# Patient Record
Sex: Male | Born: 1970 | Race: Black or African American | Hispanic: No | Smoking: Current every day smoker
Health system: Southern US, Community
[De-identification: ages and names within clinical notes are randomized; demographics above are authoritative.]

## PROBLEM LIST (undated history)

## (undated) HISTORY — PX: APPENDECTOMY: SHX54

---

## 2003-09-28 ENCOUNTER — Emergency Department (HOSPITAL_COMMUNITY): Admission: EM | Admit: 2003-09-28 | Discharge: 2003-09-28 | Payer: Self-pay | Admitting: Emergency Medicine

## 2004-02-14 ENCOUNTER — Emergency Department (HOSPITAL_COMMUNITY): Admission: EM | Admit: 2004-02-14 | Discharge: 2004-02-14 | Payer: Self-pay | Admitting: Emergency Medicine

## 2004-02-14 ENCOUNTER — Emergency Department (HOSPITAL_COMMUNITY): Admission: EM | Admit: 2004-02-14 | Discharge: 2004-02-14 | Payer: Self-pay | Admitting: Family Medicine

## 2004-11-05 ENCOUNTER — Emergency Department (HOSPITAL_COMMUNITY): Admission: EM | Admit: 2004-11-05 | Discharge: 2004-11-05 | Payer: Self-pay | Admitting: Emergency Medicine

## 2005-07-17 ENCOUNTER — Emergency Department (HOSPITAL_COMMUNITY): Admission: EM | Admit: 2005-07-17 | Discharge: 2005-07-17 | Payer: Self-pay | Admitting: *Deleted

## 2006-10-18 ENCOUNTER — Ambulatory Visit: Payer: Self-pay | Admitting: *Deleted

## 2006-10-18 ENCOUNTER — Ambulatory Visit: Payer: Self-pay | Admitting: Family Medicine

## 2008-01-24 ENCOUNTER — Emergency Department (HOSPITAL_COMMUNITY): Admission: EM | Admit: 2008-01-24 | Discharge: 2008-01-24 | Payer: Self-pay | Admitting: Family Medicine

## 2009-09-29 ENCOUNTER — Ambulatory Visit: Payer: Self-pay | Admitting: Internal Medicine

## 2012-08-17 ENCOUNTER — Emergency Department (INDEPENDENT_AMBULATORY_CARE_PROVIDER_SITE_OTHER)
Admission: EM | Admit: 2012-08-17 | Discharge: 2012-08-17 | Disposition: A | Discharge status: Home / Self Care | Payer: Self-pay | Source: Home / Self Care

## 2012-08-17 ENCOUNTER — Emergency Department (INDEPENDENT_AMBULATORY_CARE_PROVIDER_SITE_OTHER): Payer: Self-pay

## 2012-08-17 ENCOUNTER — Encounter (HOSPITAL_COMMUNITY): Payer: Self-pay | Admitting: Emergency Medicine

## 2012-08-17 DIAGNOSIS — M79609 Pain in unspecified limb: Secondary | ICD-10-CM

## 2012-08-17 DIAGNOSIS — S62639A Displaced fracture of distal phalanx of unspecified finger, initial encounter for closed fracture: Secondary | ICD-10-CM

## 2012-08-17 DIAGNOSIS — M5412 Radiculopathy, cervical region: Secondary | ICD-10-CM

## 2012-08-17 DIAGNOSIS — M79645 Pain in left finger(s): Secondary | ICD-10-CM

## 2012-08-17 NOTE — ED Notes (Signed)
Pt is here for left ring finger inj  Reports that a metal stand weighing about 45-50lbs smashed his finger while taking out from a vehicle States his left arm gave out on him and went numb; been going on for some yrs now; no PCP Laceration at tip of finger  Last tetanus was w/in a year Took tyle for the discomfort  He is alert and oriented w/no signs of acute distress.

## 2012-08-17 NOTE — ED Provider Notes (Signed)
History     CSN: [id number]  Arrival date & time 08/17/12  1414   None     Chief Complaint  Patient presents with  . Finger Injury    (Consider location/radiation/quality/duration/timing/severity/associated sxs/prior treatment) HPI Comments: Travis West presents today with a left 4th finger injury. Dropped a box at worked yesterday, and finger caught between 2 heavy objects at work. Small cut to the finger pad, and swelling, worsening since last night. No nail involvement. He has had previous work up for numbness and tingling in his left arm sporadically. He was told in the past possible "disc" in his neck but has not sought further treatment. This is the reason for dropping the box yesterday. He denies loss of strength in the left arm, mild cervical stiffness, and noted left para scapular discomfort at times.   The history is provided by the patient.    History reviewed. No pertinent past medical history.  History reviewed. No pertinent past surgical history.  No family history on file.  History  Substance Use Topics  . Smoking status: Current Every Day Smoker    Types: Cigarettes  . Smokeless tobacco: Not on file  . Alcohol Use: Yes      Review of Systems  Constitutional: Negative.   Neurological: Positive for numbness. Negative for dizziness, tremors, seizures, syncope and weakness.    Allergies  Review of patient's allergies indicates no known allergies.  Home Medications  No current outpatient prescriptions on file.  BP 129/76  Pulse 67  Temp(Src) 98.4 F (36.9 C) (Oral)  Resp 18  SpO2 98%  Physical Exam  Constitutional: He is oriented to person, place, and time. He appears well-developed and well-nourished.  Neck: Neck supple.  Musculoskeletal: Normal range of motion.  Neurological: He is oriented to person, place, and time. He displays normal reflexes. He exhibits normal muscle tone.  Skin: Skin is warm and dry.    ED Course  Procedures  (including critical care time)  Labs Reviewed - No data to display No results found.   No diagnosis found.    MDM   Distal Tuft fracture-Splint-Hand Ortho f/u Cervical Radiculopathy f/u       Azucena Fallen, PA-C 08/17/12 1522

## 2012-08-19 NOTE — ED Provider Notes (Signed)
Medical screening examination/treatment/procedure(s) were performed by resident physician or non-physician practitioner and as supervising physician I was immediately available for consultation/collaboration.   Barkley Bruns MD.   Linna Hoff, MD 08/19/12 [phone]

## 2013-01-30 ENCOUNTER — Ambulatory Visit: Payer: Self-pay | Admitting: Emergency Medicine

## 2013-01-30 VITALS — BP 138/84 | HR 69 | Temp 98.9°F | Resp 18 | Ht 72.0 in | Wt 199.0 lb

## 2013-01-30 DIAGNOSIS — Z0289 Encounter for other administrative examinations: Secondary | ICD-10-CM

## 2013-01-30 NOTE — Progress Notes (Signed)
  Subjective:    Patient ID: Travis West, male    DOB: 20-Aug-1970, 42 y.o.   MRN: [id number]  HPI  DOT certification  Review of Systems    As per HPI, otherwise negative.   Objective:   Physical Exam  GEN: WDWN, NAD, Non-toxic, A & O x 3 HEENT: Atraumatic, Normocephalic. Neck supple. No masses, No LAD. Ears and Nose: No external deformity. CV: RRR, No M/G/R. No JVD. No thrill. No extra heart sounds. PULM: CTA B, no wheezes, crackles, rhonchi. No retractions. No resp. distress. No accessory muscle use. ABD: S, NT, ND, +BS. No rebound. No HSM. EXTR: No c/c/e NEURO Normal gait.  PSYCH: Normally interactive. Conversant. Not depressed or anxious appearing.  Calm demeanor.        Assessment & Plan:    DOT certification

## 2016-04-18 ENCOUNTER — Emergency Department (HOSPITAL_COMMUNITY)
Admission: EM | Admit: 2016-04-18 | Discharge: 2016-04-18 | Disposition: A | Discharge status: Home / Self Care | Payer: BLUE CROSS/BLUE SHIELD | Attending: Emergency Medicine | Admitting: Emergency Medicine

## 2016-04-18 ENCOUNTER — Encounter (HOSPITAL_COMMUNITY): Payer: Self-pay

## 2016-04-18 DIAGNOSIS — Z79899 Other long term (current) drug therapy: Secondary | ICD-10-CM | POA: Insufficient documentation

## 2016-04-18 DIAGNOSIS — Z87891 Personal history of nicotine dependence: Secondary | ICD-10-CM | POA: Insufficient documentation

## 2016-04-18 DIAGNOSIS — R21 Rash and other nonspecific skin eruption: Secondary | ICD-10-CM | POA: Insufficient documentation

## 2016-04-18 DIAGNOSIS — K0889 Other specified disorders of teeth and supporting structures: Secondary | ICD-10-CM | POA: Insufficient documentation

## 2016-04-18 MED ORDER — CLINDAMYCIN HCL 150 MG PO CAPS: 450.0000 mg | ORAL_CAPSULE | Freq: Three times a day (TID) | ORAL | 0 refills | Status: AC

## 2016-04-18 MED ORDER — TRAMADOL HCL 50 MG PO TABS
50.0000 mg | ORAL_TABLET | Freq: Once | ORAL | Status: AC
  Administered 2016-04-18: 50 mg via ORAL
  Filled 2016-04-18: qty 1

## 2016-04-18 MED ORDER — CLINDAMYCIN HCL 300 MG PO CAPS
450.0000 mg | ORAL_CAPSULE | Freq: Once | ORAL | Status: AC
  Administered 2016-04-18: 450 mg via ORAL
  Filled 2016-04-18: qty 1

## 2016-04-18 MED ORDER — TRAMADOL HCL 50 MG PO TABS: 50.0000 mg | ORAL_TABLET | Freq: Every day | ORAL | 0 refills | Status: DC

## 2016-04-18 NOTE — Discharge Instructions (Signed)
Read the information below.  I have prescribed antibiotics for your dental pain and for the sore on your buttock. Please take as directed. You can take tylenol/motrin for mild to moderate pain. I have prescribed tramadol for severe pain. It is very important that you follow up with a dentist. I have provided the contact information above for a dentist as well as additional resources.  Please follow up with your primary provider if symptoms persist. If you do not have a primary doctor, contact information in your discharge paperwork will help you to establish one.  Use the prescribed medication as directed.  Please discuss all new medications with your pharmacist.   You may return to the Emergency Department at any time for worsening condition or any new symptoms that concern you. Please return if you develop fever, difficulty swallowing, difficulty breathing, the area on your buttock increases in size/redness/drains, or any other new/concerning symptoms.

## 2016-04-18 NOTE — ED Triage Notes (Signed)
Pt complains of right sided mouth pain for two months, he states its upper and lower ans thinks the teeth need to be pulled

## 2016-04-18 NOTE — ED Provider Notes (Signed)
[facility]-EMERGENCY DEPT Provider Note   CSN: [id number] Arrival date & time: 04/18/16  0421     History   Chief Complaint Chief Complaint  Patient presents with  . Dental Pain    HPI Travis West is a 45 y.o. male.  Travis West is a 45 y.o. male presents to ED with complaint of dental pain and rash on right buttock. Patient reports he has 2 broken teeth (previously had fillings) and 1 cracked tooth on right side of mouth for some time now. A couple weeks ago patient started experiencing pain that has progressed prompting evaluation in ED. States pain is constant, throbbing sensation. No exacerbating or alleviating factors. Has tried ibuprofen and OTC pain medication with minimal relief. No known trauma to mouth. Denies fever, trouble swallowing, trouble breathing, headache, N/V, or immunocompromising conditions. Does not currently have a dentist. Patient also complains of "red pimple" on right buttock. His girlfriend "popped" the "bump" two days ago with some purulent discharge. He complains of redness and discomfort to area. No h/o boils in past.       History reviewed. No pertinent past medical history.  There are no active problems to display for this patient.   Past Surgical History:  Procedure Laterality Date  . APPENDECTOMY         Home Medications    Prior to Admission medications   Medication Sig Start Date End Date Taking? Authorizing Provider  clindamycin (CLEOCIN) 150 MG capsule Take 3 capsules (450 mg total) by mouth 3 (three) times daily. 04/18/16 04/25/16  Lona Kettle, PA-C  traMADol (ULTRAM) 50 MG tablet Take 1 tablet (50 mg total) by mouth daily. 04/18/16   Lona Kettle, PA-C    Family History Family History  Problem Relation Age of Onset  . Cancer Maternal Grandfather     Lung Cancer    Social History Social History  Substance Use Topics  . Smoking status: Former Smoker    Types: Cigarettes    Quit date: 10/30/2012  .  Smokeless tobacco: Never Used  . Alcohol use Yes     Allergies   Patient has no known allergies.   Review of Systems Review of Systems  Constitutional: Negative for fever.  HENT: Positive for dental problem. Negative for trouble swallowing.   Respiratory: Negative for shortness of breath.   Gastrointestinal: Negative for nausea and vomiting.  Skin: Positive for rash.  Allergic/Immunologic: Negative for immunocompromised state.     Physical Exam Updated Vital Signs BP 161/88 (BP Location: Right Arm)   Pulse 83   Temp 99 F (37.2 C) (Oral)   Resp 18   Ht 6\' 1"  (1.854 m)   Wt 84.8 kg   SpO2 100%   BMI 24.67 kg/m   Physical Exam  Constitutional: He appears well-developed and well-nourished. No distress.  HENT:  Head: Normocephalic and atraumatic.  Mouth/Throat: Uvula is midline, oropharynx is clear and moist and mucous membranes are normal. No trismus in the jaw. Abnormal dentition. Dental caries present. No oropharyngeal exudate.  No trismus. Uvula midline and rises symmetrically. Partially edentulous. Dental caries appreciated. TTP of #3 and #30 without surrounding gingival swelling or erythema. No obvious abscess. No oral swelling. No sublingual or submental swelling.   Eyes: Conjunctivae and EOM are normal. Pupils are equal, round, and reactive to light. Right eye exhibits no discharge. Left eye exhibits no discharge. No scleral icterus.  Neck: Normal range of motion and phonation normal. Neck supple. No neck rigidity. Normal range of motion  present.  No nuchal rigidity.   Cardiovascular: Normal rate, regular rhythm, normal heart sounds and intact distal pulses.   No murmur heard. Pulmonary/Chest: Effort normal and breath sounds normal. No stridor. No respiratory distress. He has no wheezes. He has no rales.  Abdominal: Soft. Bowel sounds are normal. He exhibits no distension. There is no tenderness. There is no rigidity, no rebound, no guarding and no CVA tenderness.    Musculoskeletal: Normal range of motion.  Lymphadenopathy:    He has no cervical adenopathy.  Neurological: He is alert. He is not disoriented. Coordination and gait normal. GCS eye subscore is 4. GCS verbal subscore is 5. GCS motor subscore is 6.  CN 2-12 grossly intact.   Skin: Skin is warm and dry. He is not diaphoretic.     Psychiatric: He has a normal mood and affect. His behavior is normal.     ED Treatments / Results  Labs (all labs ordered are listed, but only abnormal results are displayed) Labs Reviewed - No data to display  EKG  EKG Interpretation None       Radiology No results found.  Procedures Procedures (including critical care time)  EMERGENCY DEPARTMENT US SOFT TISSUE INTERPRETATION "Study: Limited Ultrasound of the noted body part in comments below"  INDICATIONS: Pain Multiple views of the body part are obtained with a multi-frequency linear probe  PERFORMED BY:  Myself  IMAGES ARCHIVED?: Yes  SIDE:Right   BODY PART:Other soft tisse (comment in note)  FINDINGS: No obvious abcess noted, no color flow appreciated  LIMITATIONS:  none  INTERPRETATION:  No abcess noted  COMMENT:  none  Medications Ordered in ED Medications  clindamycin (CLEOCIN) capsule 450 mg (not administered)  traMADol (ULTRAM) tablet 50 mg (50 mg Oral Given 04/18/16 0707)     Initial Impression / Assessment and Plan / ED Course  I have reviewed the triage vital signs and the nursing notes.  Pertinent labs & imaging results that were available during my care of the patient were reviewed by me and considered in my medical decision making (see chart for details).  Clinical Course     Patient presents to ED with complaint of dental pain and rash on right buttock. Patient is afebrile and non-toxic appearing in NAD. Vital signs remarkable for mildly elevated BP, otherwise stable. TTP #3 and #30.  No gross abscess.  Low suspicion for Ludwig's angina or spread of  infection. No trismus. Uvula midline. No nuchal rigidity. Managing oral secretions. Will treat with clindamycnie and pain medicine - tylenol/motrin for mild to moderate pain and Rx tramadol for severe pain. Urged patient to follow-up with dentist, resources provided.    Pt also reports bump on right buttock: red papule with mild surrounding erythema. Mild TTP. No appreciable fluctuance. Bedside US performed by me did not show any obvious abscess. Girlfriend reports having a similar bump that she believes was a spider bite. ?insect bite vs. ?cellulitis vs. ?resolving abscess given girlfriend "popped" two days ago. ABX used to treat dental pain will cover for cellulitis. Encouraged follow up with PCP. Return precautions given. Pt voiced understanding and is agreeable.   Final Clinical Impressions(s) / ED Diagnoses   Final diagnoses:  Pain, dental  Rash    New Prescriptions New Prescriptions   CLINDAMYCIN (CLEOCIN) 150 MG CAPSULE    Take 3 capsules (450 mg total) by mouth 3 (three) times daily.   TRAMADOL (ULTRAM) 50 MG TABLET    Take 1 tablet (50 mg total) by  mouth daily.     Herminio Commonsshley Laurel Green CampMeyer, [place] JerseyPA-C 04/18/16 [id number]    Dione Boozeavid Glick, MD 04/18/16 [id number]

## 2016-07-17 ENCOUNTER — Encounter (HOSPITAL_COMMUNITY): Payer: Self-pay | Admitting: Emergency Medicine

## 2016-07-17 ENCOUNTER — Emergency Department (HOSPITAL_COMMUNITY)
Admission: EM | Admit: 2016-07-17 | Discharge: 2016-07-17 | Disposition: A | Discharge status: Home / Self Care | Payer: BLUE CROSS/BLUE SHIELD | Attending: Emergency Medicine | Admitting: Emergency Medicine

## 2016-07-17 DIAGNOSIS — K047 Periapical abscess without sinus: Secondary | ICD-10-CM | POA: Insufficient documentation

## 2016-07-17 DIAGNOSIS — F17210 Nicotine dependence, cigarettes, uncomplicated: Secondary | ICD-10-CM | POA: Insufficient documentation

## 2016-07-17 MED ORDER — HYDROCODONE-ACETAMINOPHEN 5-325 MG PO TABS: 1.0000 | ORAL_TABLET | Freq: Four times a day (QID) | ORAL | 0 refills | Status: DC | PRN

## 2016-07-17 MED ORDER — HYDROCODONE-ACETAMINOPHEN 5-325 MG PO TABS
1.0000 | ORAL_TABLET | Freq: Once | ORAL | Status: AC
  Administered 2016-07-17: 1 via ORAL
  Filled 2016-07-17: qty 1

## 2016-07-17 MED ORDER — IBUPROFEN 600 MG PO TABS: 600.0000 mg | ORAL_TABLET | Freq: Four times a day (QID) | ORAL | 0 refills | Status: DC | PRN

## 2016-07-17 MED ORDER — PENICILLIN V POTASSIUM 500 MG PO TABS: 500.0000 mg | ORAL_TABLET | Freq: Four times a day (QID) | ORAL | 0 refills | Status: AC

## 2016-07-17 MED FILL — HYDROCODON-APAP 5-325: 5-325 | 2 days supply | Qty: 6 | Fill #0

## 2016-07-17 MED FILL — PENICILLIN VK 500 MG TABLET: 500 | 10 days supply | Qty: 40 | Fill #0

## 2016-07-17 MED FILL — CHLORHEXIDINE 0.12% RINSE: 0.12 | 17 days supply | Qty: 473 | Fill #0

## 2016-07-17 MED FILL — IBUPROFEN 600 MG TABLET: 600 | 8 days supply | Qty: 30 | Fill #0

## 2016-07-17 NOTE — ED Provider Notes (Signed)
[facility]-EMERGENCY DEPT Provider Note   CSN: [id number] Arrival date & time: 07/17/16  4098     History   Chief Complaint Chief Complaint  Patient presents with  . Dental Pain    HPI Travis West is a 46 y.o. male.  HPI Travis West is a 46 y.o. male presents to ED with complaint of dental pain. States pain started several weeks ago but worsened in the last 2 days. States feels like face is swelling. Denies fever or chills. No difficulty swallowing. No trismus. Taking ibuprofen and tylenol for pain with no improvement. Does not have a dentist. No dental injuries. States "I know I have bad teeth but I cant afford to go get them fixed right now."  History reviewed. No pertinent past medical history.  There are no active problems to display for this patient.   Past Surgical History:  Procedure Laterality Date  . APPENDECTOMY         Home Medications    Prior to Admission medications   Medication Sig Start Date End Date Taking? Authorizing Provider  traMADol (ULTRAM) 50 MG tablet Take 1 tablet (50 mg total) by mouth daily. 04/18/16   Lona Kettle, PA-C    Family History Family History  Problem Relation Age of Onset  . Cancer Maternal Grandfather     Lung Cancer    Social History Social History  Substance Use Topics  . Smoking status: Current Every Day Smoker    Packs/day: 0.25    Years: 2.00    Types: Cigarettes  . Smokeless tobacco: Never Used  . Alcohol use Yes     Comment: occasional     Allergies   Patient has no known allergies.   Review of Systems Review of Systems  Constitutional: Negative for chills and fever.  HENT: Positive for dental problem and facial swelling. Negative for trouble swallowing.   Musculoskeletal: Negative for myalgias.  Neurological: Positive for headaches.  All other systems reviewed and are negative.    Physical Exam Updated Vital Signs BP 159/77 (BP Location: Right Arm)   Pulse 79   Temp 98.1 F (36.7 C)  (Oral)   Resp 16   SpO2 100%   Physical Exam  Constitutional: He appears well-developed and well-nourished. No distress.  HENT:  Mild right facial swelling. Dental carries in right lower 2nd molar with surrounding gum swelling and an abscess. Dental carries in right lower 1st premolar. ttp over both of those teeth. No trismus. No swelling under the tongue.   Eyes: Conjunctivae are normal.  Neck: Neck supple.  Cardiovascular: Normal rate.   Pulmonary/Chest: No respiratory distress.  Abdominal: He exhibits no distension.  Skin: Skin is warm and dry.  Nursing note and vitals reviewed.    ED Treatments / Results  Labs (all labs ordered are listed, but only abnormal results are displayed) Labs Reviewed - No data to display  EKG  EKG Interpretation None       Radiology No results found.  Procedures Procedures (including critical care time)  Medications Ordered in ED Medications  HYDROcodone-acetaminophen (NORCO/VICODIN) 5-325 MG per tablet 1 tablet (not administered)     Initial Impression / Assessment and Plan / ED Course  I have reviewed the triage vital signs and the nursing notes.  Pertinent labs & imaging results that were available during my care of the patient were reviewed by me and considered in my medical decision making (see chart for details).     Pt in ED with dental pain.  Dental abscess noted on exam. Minimal facial swelling. No trismus. No swelling under the tongue. No evidence of ludwig's angina. Afebrile. Non toxic. Will start on penicillin. Ibuprofen for pain. 6 tabs of norco. Follow up with oral surgery.  Vitals:   07/17/16 0721 07/17/16 0745  BP: 159/77 133/86  Pulse: 79 71  Resp: 16   Temp: 98.1 F (36.7 C)   TempSrc: Oral   SpO2: 100% 100%      Final Clinical Impressions(s) / ED Diagnoses   Final diagnoses:  Dental abscess    New Prescriptions New Prescriptions   HYDROCODONE-ACETAMINOPHEN (NORCO) 5-325 MG TABLET    Take 1 tablet  by mouth every 6 (six) hours as needed for moderate pain.   IBUPROFEN (ADVIL,MOTRIN) 600 MG TABLET    Take 1 tablet (600 mg total) by mouth every 6 (six) hours as needed.   PENICILLIN V POTASSIUM (VEETID) 500 MG TABLET    Take 1 tablet (500 mg total) by mouth 4 (four) times daily.     Jaynie Crumbleatyana Alyzae Hawkey, PA-C 07/17/16 0800    Gerhard Munchobert Lockwood, MD 07/17/16 1547

## 2016-07-17 NOTE — Discharge Planning (Signed)
Pt up for discharge. EDCM reviewed chart for possible CM needs.  No needs identified or communicated.  

## 2016-07-17 NOTE — ED Triage Notes (Signed)
Patient complains of dental pain x1 week.  Patients state he has broken teeth in bottom right and top left of mouth.  No other complaints at this time.

## 2016-07-17 NOTE — Discharge Instructions (Signed)
Ibuprofen for pain. Norco for severe pain only. Take penicillin as prescribed until all gone. Follow up with dentist.

## 2017-01-29 ENCOUNTER — Encounter (HOSPITAL_COMMUNITY): Payer: Self-pay | Admitting: Family Medicine

## 2017-01-29 DIAGNOSIS — K047 Periapical abscess without sinus: Secondary | ICD-10-CM | POA: Insufficient documentation

## 2017-01-29 DIAGNOSIS — K122 Cellulitis and abscess of mouth: Secondary | ICD-10-CM | POA: Insufficient documentation

## 2017-01-29 DIAGNOSIS — F17210 Nicotine dependence, cigarettes, uncomplicated: Secondary | ICD-10-CM | POA: Insufficient documentation

## 2017-01-29 DIAGNOSIS — Z79899 Other long term (current) drug therapy: Secondary | ICD-10-CM | POA: Insufficient documentation

## 2017-01-29 NOTE — ED Triage Notes (Signed)
Patient has an abscess to the left side of his bread. Patient reports he had drainage from the abscess yesterday but not today. Denies fever. Pt's lower lip is swollen and possibly from the abscess.

## 2017-01-30 ENCOUNTER — Emergency Department (HOSPITAL_COMMUNITY)
Admission: EM | Admit: 2017-01-30 | Discharge: 2017-01-30 | Disposition: A | Discharge status: Home / Self Care | Payer: Self-pay | Attending: Emergency Medicine | Admitting: Emergency Medicine

## 2017-01-30 ENCOUNTER — Emergency Department (HOSPITAL_COMMUNITY): Payer: Self-pay

## 2017-01-30 ENCOUNTER — Encounter (HOSPITAL_COMMUNITY): Payer: Self-pay | Admitting: Radiology

## 2017-01-30 DIAGNOSIS — K122 Cellulitis and abscess of mouth: Secondary | ICD-10-CM

## 2017-01-30 DIAGNOSIS — K047 Periapical abscess without sinus: Secondary | ICD-10-CM

## 2017-01-30 LAB — CBC
HEMATOCRIT: 39.9 % (ref 39.0–52.0)
Hemoglobin: 13.6 g/dL (ref 13.0–17.0)
MCH: 30.4 pg (ref 26.0–34.0)
MCHC: 34.1 g/dL (ref 30.0–36.0)
MCV: 89.3 fL (ref 78.0–100.0)
PLATELETS: 277 10*3/uL (ref 150–400)
RBC: 4.47 MIL/uL (ref 4.22–5.81)
RDW: 12.9 % (ref 11.5–15.5)
WBC: 16.4 10*3/uL — AB (ref 4.0–10.5)

## 2017-01-30 LAB — I-STAT CHEM 8, ED
BUN: 3 mg/dL — ABNORMAL LOW (ref 6–20)
CREATININE: 1 mg/dL (ref 0.61–1.24)
Calcium, Ion: 1.04 mmol/L — ABNORMAL LOW (ref 1.15–1.40)
Chloride: 103 mmol/L (ref 101–111)
GLUCOSE: 83 mg/dL (ref 65–99)
HEMATOCRIT: 44 % (ref 39.0–52.0)
HEMOGLOBIN: 15 g/dL (ref 13.0–17.0)
Potassium: 4.7 mmol/L (ref 3.5–5.1)
Sodium: 139 mmol/L (ref 135–145)
TCO2: 29 mmol/L (ref 22–32)

## 2017-01-30 MED ORDER — FENTANYL CITRATE (PF) 100 MCG/2ML IJ SOLN
12.5000 ug | Freq: Once | INTRAMUSCULAR | Status: AC
  Administered 2017-01-30: 12.5 ug via INTRAVENOUS
  Filled 2017-01-30: qty 2

## 2017-01-30 MED ORDER — CLINDAMYCIN HCL 300 MG PO CAPS: 300.0000 mg | ORAL_CAPSULE | Freq: Four times a day (QID) | ORAL | 0 refills | Status: AC

## 2017-01-30 MED ORDER — CLINDAMYCIN PHOSPHATE 300 MG/50ML IV SOLN
300.0000 mg | Freq: Once | INTRAVENOUS | Status: AC
  Administered 2017-01-30: 300 mg via INTRAVENOUS
  Filled 2017-01-30: qty 50

## 2017-01-30 MED ORDER — LIDOCAINE-EPINEPHRINE (PF) 2 %-1:200000 IJ SOLN
10.0000 mL | Freq: Once | INTRAMUSCULAR | Status: AC
  Administered 2017-01-30: 10 mL
  Filled 2017-01-30: qty 20

## 2017-01-30 MED ORDER — OXYCODONE-ACETAMINOPHEN 5-325 MG PO TABS
1.0000 | ORAL_TABLET | Freq: Once | ORAL | Status: AC
  Administered 2017-01-30: 1 via ORAL
  Filled 2017-01-30: qty 1

## 2017-01-30 MED ORDER — IOPAMIDOL (ISOVUE-300) INJECTION 61%
INTRAVENOUS | Status: AC
  Filled 2017-01-30: qty 75

## 2017-01-30 MED ORDER — OXYCODONE-ACETAMINOPHEN 5-325 MG PO TABS: 1.0000 | ORAL_TABLET | Freq: Three times a day (TID) | ORAL | 0 refills | Status: DC | PRN

## 2017-01-30 MED ORDER — IOPAMIDOL (ISOVUE-300) INJECTION 61%
75.0000 mL | Freq: Once | INTRAVENOUS | Status: AC | PRN
  Administered 2017-01-30: 75 mL via INTRAVENOUS

## 2017-01-30 MED FILL — OXYCODONE W/APAP 5/325 TAB: 5-325 | 3 days supply | Qty: 8 | Fill #0

## 2017-01-30 MED FILL — CLINDAMYCIN HCL 300 MG CAPS: 300 | 7 days supply | Qty: 28 | Fill #0

## 2017-01-30 NOTE — ED Provider Notes (Signed)
[facility]-EMERGENCY DEPT Provider Note   CSN: [id number] Arrival date & time: 01/29/17  1909     History   Chief Complaint Chief Complaint  Patient presents with  . Abscess    HPI Travis West is a 46 y.o. male who presents to the emergency department with a chief complaint of abscess. The patient reports he thought he was getting a "hair bump" 6 days ago, but was unable to tell because of his beard. He states that his entire chin and right lip have continued to become more swollen and painful over the last 2 days. Yesterday, the bump on his chin began to spontaneously drain yellow fluid. He reports he has been unable to eat for the last 2 days secondary to the pain.    The history is provided by the patient. No language interpreter was used.    History reviewed. No pertinent past medical history.  There are no active problems to display for this patient.   Past Surgical History:  Procedure Laterality Date  . APPENDECTOMY         Home Medications    Prior to Admission medications   Medication Sig Start Date End Date Taking? Authorizing Provider  acetaminophen (TYLENOL) 500 MG tablet Take 1,000 mg by mouth every 4 (four) hours as needed for moderate pain.   Yes [provider]  Aspirin-Acetaminophen-Caffeine (GOODY HEADACHE PO) Take 1 each by mouth daily as needed ([aom).   Yes [provider]  clindamycin (CLEOCIN) 300 MG capsule Take 1 capsule (300 mg total) by mouth every 6 (six) hours. 01/30/17 02/06/17  Lindzy Rupert A, PA-C  oxyCODONE-acetaminophen (PERCOCET/ROXICET) 5-325 MG tablet Take 1 tablet by mouth every 8 (eight) hours as needed for severe pain. 01/30/17   Aurea Aronov A, PA-C    Family History Family History  Problem Relation Age of Onset  . Cancer Maternal Grandfather        Lung Cancer    Social History Social History  Substance Use Topics  . Smoking status: Current Every Day Smoker    Packs/day: 0.25    Years: 2.00    Types:  Cigarettes  . Smokeless tobacco: Never Used  . Alcohol use Yes     Comment: 2 times a month     Allergies   Patient has no known allergies.   Review of Systems Review of Systems  Constitutional: Negative for activity change, chills and fever.  HENT: Positive for dental problem, facial swelling, sore throat, trouble swallowing and voice change. Negative for sinus pain and sinus pressure.   Respiratory: Negative for apnea and shortness of breath.   Cardiovascular: Negative for chest pain.  Gastrointestinal: Negative for abdominal pain.  Musculoskeletal: Negative for back pain.  Skin: Negative for rash.     Physical Exam Updated Vital Signs BP (!) 154/94 (BP Location: Right Arm)   Pulse 93   Temp 98 F (36.7 C) (Oral)   Resp 17   Ht  (1.854 m)   Wt 86.2 kg (190 lb)   SpO2 98%   BMI 25.07 kg/m   Physical Exam  Constitutional: He appears well-developed. No distress.  Muffled voice. Uncomfortable appearing.   HENT:  Head: Normocephalic.  Mouth/Throat: Uvula is midline, oropharynx is clear and moist and mucous membranes are normal. No trismus in the jaw. Abnormal dentition. Dental caries present. No uvula swelling.  Significant swelling over the right mental area extending down over the right anterior superior neck. No induration noted. There is a fluctuant area  over left mid-left mental area with crusty and minimal purulent material.   Poor dentition. Numerous teeth have been pulled. No gross abscess noted in the right buccal mucosa or the gingiva.   Eyes: Conjunctivae are normal.  Neck: Neck supple.  Cardiovascular: Normal rate and regular rhythm.   No murmur heard. Pulmonary/Chest: Effort normal.  Abdominal: Soft. He exhibits no distension.  Neurological: He is alert.  Skin: Skin is warm and dry.  Psychiatric: His behavior is normal.  Nursing note and vitals reviewed.    ED Treatments / Results  Labs (all labs ordered are listed, but only abnormal results  are displayed) Labs Reviewed  CBC - Abnormal; Notable for the following:       Result Value   WBC 16.4 (*)    All other components within normal limits  I-STAT CHEM 8, ED - Abnormal; Notable for the following:    BUN 3 (*)    Calcium, Ion 1.04 (*)    All other components within normal limits    EKG  EKG Interpretation None       Radiology Ct Soft Tissue Neck W Contrast  Result Date: 01/30/2017 CLINICAL DATA:  Neck mass, solitary. Chin pain worse on the right. Tenderness and swelling. EXAM: CT NECK WITH CONTRAST TECHNIQUE: Multidetector CT imaging of the neck was performed using the standard protocol following the bolus administration of intravenous contrast. CONTRAST:  75mL ISOVUE-300 IOPAMIDOL (ISOVUE-300) INJECTION 61% COMPARISON:  None. FINDINGS: Pharynx and larynx: No suspicious enhancement or swelling. Negative floor of mouth. Salivary glands: Small stone in the right parotid tail. No inflammation or masslike finding. Thyroid: Normal Lymph nodes: Mild reactive appearing enlargement of submental lymph nodes. No suppurative changes. Vascular: Major vessels are patent.  No noted atheromatous changes. Limited intracranial: Negative Visualized orbits: Negative Mastoids and visualized paranasal sinuses: Mild patchy ethmoid mucosal thickening. No acute finding. Skeleton: There is a lucency in the right mandible between teeth 26 and 27 with small defect in the buccal cortex. Contiguous with this area is a channel like low-density measuring 3 cm in length by 9 mm in thickness, extending to the skin surface. The neighboring soft tissues are thickened and stranded. Multiple right-sided mandibular teeth have been extracted. There is no periosteal reaction or involucrum in the mandible. Upper chest: No acute finding.  Negative for pneumonia. Other: Spondylosis and disc degeneration. Spurring at C5-6 and C6-7 encroaches on the ventral cord. At the same level there is foraminal narrowing, particularly  severe on the right at C6-7. IMPRESSION: Chin cellulitis surrounding a channel like collection/abscess measuring 30 x 9 mm, nearly erupted through the left chin skin surface. The deep portion of the collection is contiguous with a lucency in the right mandible present between teeth 26 and 27, consider primary odontogenic infection. No definitive changes of osteomyelitis in the mandible. Electronically Signed   By: Marnee Spring M.D.   On: 01/30/2017 10:50    Procedures .Marland KitchenIncision and Drainage Date/Time: 01/30/2017 5:00 PM Performed by: [facility], Barre Aydelott A Authorized by: Frederik Pear A   Consent:    Consent obtained:  Verbal   Consent given by:  Patient   Risks discussed:  Incomplete drainage, pain, infection, damage to other organs and bleeding   Alternatives discussed:  Referral Location:    Type:  Abscess   Location:  Head   Head/neck location: chin. Pre-procedure details:    Skin preparation:  Betadine Anesthesia (see MAR for exact dosages):    Anesthesia method:  Local infiltration  Local anesthetic:  Lidocaine 2% WITH epi Procedure type:    Complexity:  Complex Procedure details:    Incision types:  Single straight   Scalpel blade:  10   Drainage:  Purulent   Drainage amount:  Scant   Wound treatment:  Wound left open   Packing materials:  None Post-procedure details:    Patient tolerance of procedure:  Tolerated well, no immediate complications   (including critical care time)  Medications Ordered in ED Medications  iopamidol (ISOVUE-300) 61 % injection (not administered)  oxyCODONE-acetaminophen (PERCOCET/ROXICET) 5-325 MG per tablet 1 tablet (1 tablet Oral Given 01/30/17 0835)  iopamidol (ISOVUE-300) 61 % injection 75 mL (75 mLs Intravenous Contrast Given 01/30/17 1021)  lidocaine-EPINEPHrine (XYLOCAINE W/EPI) 2 %-1:200000 (PF) injection 10 mL (10 mLs Infiltration Given 01/30/17 1337)  fentaNYL (SUBLIMAZE) injection 12.5 mcg (12.5 mcg Intravenous Given 01/30/17 1338)    clindamycin (CLEOCIN) IVPB 300 mg (0 mg Intravenous Stopped 01/30/17 1436)     Initial Impression / Assessment and Plan / ED Course  I have reviewed the triage vital signs and the nursing notes.  Pertinent labs & imaging results that were available during my care of the patient were reviewed by me and considered in my medical decision making (see chart for details).     46 red male presenting with right sided chin and right anterior neck swelling with voice change. The patient has a history of untreated dental pain. CT neck demonstrating cellulitis of the chin and an abscess measuring 30 x 9 mm, nearly erupting through the left chin skin surface, which appears to be of odontogenic origin. WBC 16. Incision and drainage attempted over the left chin. Minimal purulent material drained. Consulted oral surgery and spoke with Dr. Barbette Merino who recommended having the patient follow up with him very soon in his office. When doses of IV clindamycin given in the ED. The patient's significant other indicates Dr. Barbette Merino can see them in the office this afternoon. We'll discharge the patient to follow-up with Dr. Barbette Merino and a prescription for clindamycin and pain control. A 50-month prescription history query was performed using the [place] CSRS prior to discharge. NAD. Strict return precautions given. The patient is safe for d/c at this time.    Final Clinical Impressions(s) / ED Diagnoses   Final diagnoses:  Dental abscess  Cellulitis and abscess of mouth    New Prescriptions Discharge Medication List as of 01/30/2017  2:22 PM    START taking these medications   Details  clindamycin (CLEOCIN) 300 MG capsule Take 1 capsule (300 mg total) by mouth every 6 (six) hours., Starting Tue 01/30/2017, Until Tue 02/06/2017, Print    oxyCODONE-acetaminophen (PERCOCET/ROXICET) 5-325 MG tablet Take 1 tablet by mouth every 8 (eight) hours as needed for severe pain., Starting Tue 01/30/2017, Print         Ivie Savitt A,  PA-C 01/30/17 1704    Mesner, Barbara Cower, MD 01/31/17 4098

## 2017-01-30 NOTE — Discharge Instructions (Addendum)
Take clindamycin once every 6 hours for the next week unless Dr. Barbette Merino changes this medication.  One tablet of Percocet may be taken once every 8 hours as needed for severe pain. Please do not drive or work while taking this medication because it can cause you to be impaired. This medication also be addicting so please use caution.   If you develop any new or worsening symptoms, including difficulty breathing, worsening swelling, or fever, please return to the emergency department for reevaluation.

## 2017-10-28 ENCOUNTER — Emergency Department (HOSPITAL_COMMUNITY): Payer: BLUE CROSS/BLUE SHIELD

## 2017-10-28 ENCOUNTER — Encounter (HOSPITAL_COMMUNITY): Payer: Self-pay | Admitting: Emergency Medicine

## 2017-10-28 ENCOUNTER — Emergency Department (HOSPITAL_COMMUNITY)
Admission: EM | Admit: 2017-10-28 | Discharge: 2017-10-28 | Disposition: A | Discharge status: Home / Self Care | Payer: BLUE CROSS/BLUE SHIELD | Attending: Emergency Medicine | Admitting: Emergency Medicine

## 2017-10-28 DIAGNOSIS — R0602 Shortness of breath: Secondary | ICD-10-CM | POA: Insufficient documentation

## 2017-10-28 DIAGNOSIS — M940 Chondrocostal junction syndrome [Tietze]: Secondary | ICD-10-CM | POA: Insufficient documentation

## 2017-10-28 DIAGNOSIS — F17210 Nicotine dependence, cigarettes, uncomplicated: Secondary | ICD-10-CM | POA: Insufficient documentation

## 2017-10-28 DIAGNOSIS — R0789 Other chest pain: Secondary | ICD-10-CM

## 2017-10-28 LAB — BASIC METABOLIC PANEL
ANION GAP: 8 (ref 5–15)
BUN: 6 mg/dL (ref 6–20)
CALCIUM: 8.4 mg/dL — AB (ref 8.9–10.3)
CO2: 27 mmol/L (ref 22–32)
CREATININE: 1.15 mg/dL (ref 0.61–1.24)
Chloride: 110 mmol/L (ref 101–111)
GFR calc non Af Amer: >60 mL/min (ref >60)
Glucose, Bld: 98 mg/dL (ref 65–99)
Potassium: 3.9 mmol/L (ref 3.5–5.1)
SODIUM: 145 mmol/L (ref 135–145)

## 2017-10-28 LAB — CBC
HCT: 51.6 % (ref 39.0–52.0)
HEMOGLOBIN: 17.3 g/dL — AB (ref 13.0–17.0)
MCH: 31.4 pg (ref 26.0–34.0)
MCHC: 33.5 g/dL (ref 30.0–36.0)
MCV: 93.6 fL (ref 78.0–100.0)
PLATELETS: 246 10*3/uL (ref 150–400)
RBC: 5.51 MIL/uL (ref 4.22–5.81)
RDW: 13 % (ref 11.5–15.5)
WBC: 13.6 10*3/uL — AB (ref 4.0–10.5)

## 2017-10-28 LAB — I-STAT TROPONIN, ED
TROPONIN I, POC: 0.01 ng/mL (ref 0.00–0.08)
TROPONIN I, POC: 0 ng/mL (ref 0.00–0.08)

## 2017-10-28 LAB — D-DIMER, QUANTITATIVE (NOT AT ARMC): D DIMER QUANT: 0.29 ug{FEU}/mL (ref 0.00–0.50)

## 2017-10-28 MED ORDER — KETOROLAC TROMETHAMINE 30 MG/ML IJ SOLN
30.0000 mg | Freq: Once | INTRAMUSCULAR | Status: AC
  Administered 2017-10-28: 30 mg via INTRAVENOUS
  Filled 2017-10-28: qty 1

## 2017-10-28 MED ORDER — NAPROXEN 500 MG PO TABS: 500.0000 mg | ORAL_TABLET | Freq: Two times a day (BID) | ORAL | 0 refills | Status: DC

## 2017-10-28 NOTE — ED Notes (Signed)
Patient eating subway in the lobby.

## 2017-10-28 NOTE — Discharge Instructions (Signed)
Your work-up today is very reassuring and does not suggest an acute problem with your heart or lungs causing this pain.  I think this is likely costochondritis which is an inflammation of the cartilage in the chest wall.  Please take Naprosyn twice daily you will need to follow-up outpatient with primary care, please use the phone number provided to establish care with a regular doctor.  Return for worsening chest pain, especially if it radiates to the arm neck or jaw and is worse with exertion, shortness of breath, or any other new or concerning symptoms.

## 2017-10-28 NOTE — ED Provider Notes (Signed)
[name]-EMERGENCY DEPT Provider Note   CSN: [id number] Arrival date & time: 10/28/17  1110     History   Chief Complaint Chief Complaint  Patient presents with  . Chest Pain  . Numbness    HPI Travis West is a 47 y.o. male.  Travis Travis West is a 47 y.o. Male who is otherwise healthy, presents to the emergency department for evaluation of 3 days of midsternal chest pain.  He reports pain is well localized to the left sternal border.  Pain is worse with deep breathing and worse with palpation, pain is not exertional in nature.  He reports he has had this pain intermittently in the past but it is never been persistent like it has been over the past few days.  He denies any injury to the chest, does do a lot of heavy lifting for work.  He reports some mild intermittent shortness of breath.  No lightheadedness or syncope over the past 3 days.  He denies any abdominal pain, nausea, vomiting or diaphoresis.  No fevers or chills, occasional cough.  Patient does smoke this is only real cardiac risk factor, no history of hypertension, diabetes or obesity.  Patient denies cocaine or any other recreational drug use.  Reports only occasional alcohol use.     History reviewed. No pertinent past medical history.  There are no active problems to display for this patient.   Past Surgical History:  Procedure Laterality Date  . APPENDECTOMY          Home Medications    Prior to Admission medications   Medication Sig Start Date End Date Taking? Authorizing Provider  acetaminophen (TYLENOL) 500 MG tablet Take 1,000 mg by mouth every 4 (four) hours as needed for moderate pain.    [provider]  Aspirin-Acetaminophen-Caffeine (GOODY HEADACHE PO) Take 1 each by mouth daily as needed ([aom).    [provider]  oxyCODONE-acetaminophen (PERCOCET/ROXICET) 5-325 MG tablet Take 1 tablet by mouth every 8 (eight) hours as needed for severe pain. 01/30/17    McDonald, Mia A, PA-C    Family History Family History  Problem Relation Age of Onset  . Cancer Maternal Grandfather        Lung Cancer    Social History Social History   Tobacco Use  . Smoking status: Current Every Day Smoker    Packs/day: 0.25    Years: 2.00    Pack years: 0.50    Types: Cigarettes  . Smokeless tobacco: Never Used  Substance Use Topics  . Alcohol use: Yes    Comment: 2 times a month  . Drug use: No     Allergies   Patient has no known allergies.   Review of Systems Review of Systems  Constitutional: Negative for chills and fever.  HENT: Negative for congestion, rhinorrhea and sore throat.   Eyes: Negative for visual disturbance.  Respiratory: Positive for shortness of breath. Negative for cough.   Cardiovascular: Positive for chest pain. Negative for palpitations and leg swelling.  Gastrointestinal: Negative for abdominal pain, nausea and vomiting.  Genitourinary: Negative for dysuria, flank pain and frequency.  Musculoskeletal: Negative for arthralgias and myalgias.  Skin: Negative for color change and rash.  Neurological: Negative for dizziness, weakness, numbness and headaches.     Physical Exam Updated Vital Signs BP (!) 138/91 (BP Location: Left Arm)   Pulse 85   Temp 97.9 F (36.6 C) (Oral)   Resp 19   SpO2 100%   Physical  Exam  Constitutional: He is oriented to person, place, and time. He appears well-developed and well-nourished.  Non-toxic appearance. He does not appear ill. No distress.  HENT:  Head: Normocephalic and atraumatic.  Mouth/Throat: Oropharynx is clear and moist.  Eyes: Pupils are equal, round, and reactive to light. EOM are normal. Right eye exhibits no discharge. Left eye exhibits no discharge.  Neck: Neck supple.  Cardiovascular: Normal rate, regular rhythm, normal heart sounds and intact distal pulses.  Pulses:      Radial pulses are 2+ on the right side, and 2+ on the left side.       Dorsalis pedis pulses  are 2+ on the right side, and 2+ on the left side.       Posterior tibial pulses are 2+ on the right side, and 2+ on the left side.  Pulmonary/Chest: Effort normal and breath sounds normal. No stridor. No respiratory distress. He has no wheezes. He has no rales.  Respirations equal and unlabored, patient able to speak in full sentences, lungs clear to auscultation bilaterally, focal tenderness along the lower left costochondral margin, no palpable deformity  Abdominal: Soft. Bowel sounds are normal. He exhibits no distension and no mass. There is no tenderness. There is no guarding.  Musculoskeletal: He exhibits no edema or deformity.  Neurological: He is alert and oriented to person, place, and time. Coordination normal.  Speech is clear, able to follow commands CN III-XII intact Normal strength in upper and lower extremities bilaterally including dorsiflexion and plantar flexion, strong and equal grip strength Sensation normal to light and sharp touch Moves extremities without ataxia, coordination intact  Skin: Skin is warm and dry. Capillary refill takes less than 2 seconds. He is not diaphoretic.  Psychiatric: He has a normal mood and affect. His behavior is normal.  Nursing note and vitals reviewed.    ED Treatments / Results  Labs (all labs ordered are listed, but only abnormal results are displayed) Labs Reviewed  BASIC METABOLIC PANEL - Abnormal; Notable for the following components:      Result Value   Calcium 8.4 (*)    All other components within normal limits  CBC - Abnormal; Notable for the following components:   WBC 13.6 (*)    Hemoglobin 17.3 (*)    All other components within normal limits  D-DIMER, QUANTITATIVE (NOT AT [facility])  I-STAT TROPONIN, ED  I-STAT TROPONIN, ED    EKG EKG Interpretation  Date/Time:  Sunday October 28 2017 11:21:52 EDT Ventricular Rate:  83 PR Interval:    QRS Duration: 84 QT Interval:  361 QTC Calculation: 425 R Axis:   81 Text  Interpretation:  Sinus rhythm Confirmed by Rolland Porter ([id number]) on 10/28/2017 5:43:35 PM   Radiology Dg Chest 2 View  Result Date: 10/28/2017 CLINICAL DATA:  Patient here from home with complains of mid upper chest pain and left arm numbness that start x3 days ago. Cough. Denies n/v. EXAM: CHEST - 2 VIEW COMPARISON:  None. FINDINGS: The heart size and mediastinal contours are within normal limits. Both lungs are clear. No pleural effusion or pneumothorax. The visualized skeletal structures are unremarkable. IMPRESSION: Normal chest radiographs. Electronically Signed   By: Amie Portland M.D.   On: 10/28/2017 12:00    Procedures Procedures (including critical care time)  Medications Ordered in ED Medications  ketorolac (TORADOL) 30 MG/ML injection 30 mg (30 mg Intravenous Given 10/28/17 1637)     Initial Impression / Assessment and Plan / ED Course  I  have reviewed the triage vital signs and the nursing notes.  Pertinent labs & imaging results that were available during my care of the patient were reviewed by me and considered in my medical decision making (see chart for details).  Patient presents for evaluation of 3 days of chest pain which is well localized to the left costochondral margin, chest pain is pleuritic in nature, nonexertional, nonradiating. Chest pain is not likely of cardiac or pulmonary etiology d/t presentation, d-dimer within normal limits, VSS, no tracheal deviation, no JVD or new murmur, RRR, breath sounds equal bilaterally, EKG without acute abnormalities, negative troponin x2, and negative CXR.  I think patient likely has costochondritis will treat with NSAIDs, dose of Toradol given here in the ED and provided prescription for naproxen.  Patient to follow-up with primary care.  Pt has been advised to return to the ED if CP becomes exertional, associated with diaphoresis or nausea, radiates to left jaw/arm, worsens or becomes concerning in any way. Pt appears reliable for  follow up and is agreeable to discharge.    Final Clinical Impressions(s) / ED Diagnoses   Final diagnoses:  Atypical chest pain  Costochondritis    ED Discharge Orders        Ordered    naproxen (NAPROSYN) 500 MG tablet  2 times daily     10/28/17 1718       Dartha Lodge, [place] 10/28/17 1747    Rolland Porter, MD 11/01/17 2229

## 2017-10-28 NOTE — ED Triage Notes (Signed)
Patient here from home with complains of mid upper chest pain and left arm numbness that start x3 days ago. Cough. Denies n/v.

## 2018-10-03 IMAGING — CT CT NECK W/ CM
4 series · 14 of 33 positions shown, 17 images · IV contrast (ISOVUE)
Comparison: None.

CLINICAL DATA: Neck mass, solitary. Chin pain worse on the right.
Tenderness and swelling.

EXAM:
CT NECK WITH CONTRAST
TECHNIQUE: Multidetector CT imaging of the neck was performed using the
standard protocol following the bolus administration of intravenous
contrast.
CONTRAST:  75mL E0SYIW-OXX IOPAMIDOL (E0SYIW-OXX) INJECTION 61%

[Series 2: neck with st · axial · 0.49mm/px · z∈[-309,-149]mm · 5 of 122 slices shown, 7 images]
[im 21/122  soft-tissue]
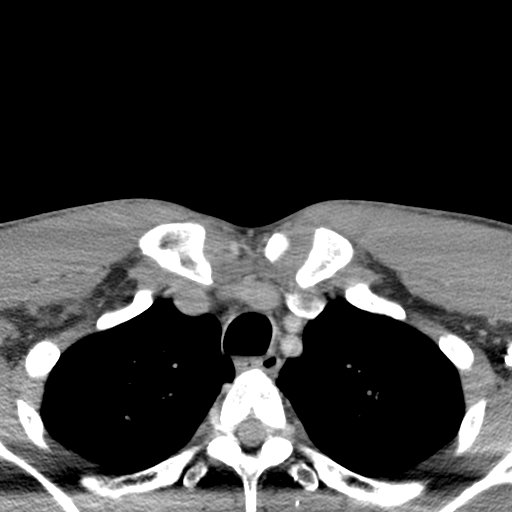
[im 21/122  bone]
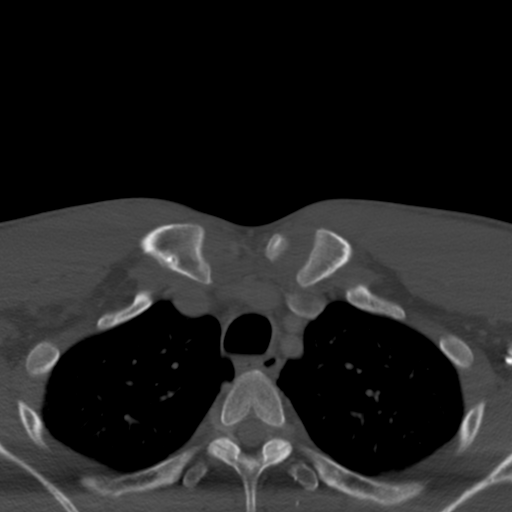
[im 41/122  bone]
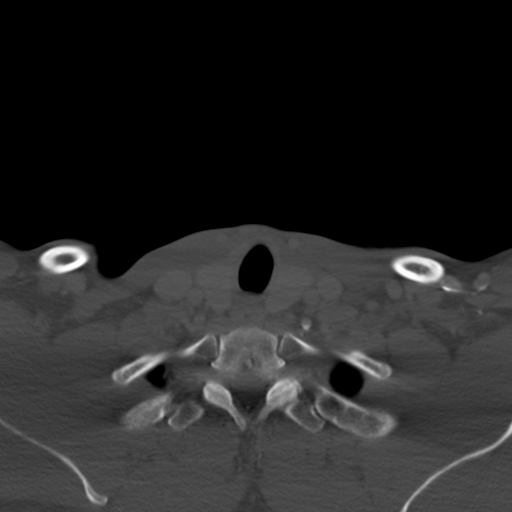
[im 61/122  bone]
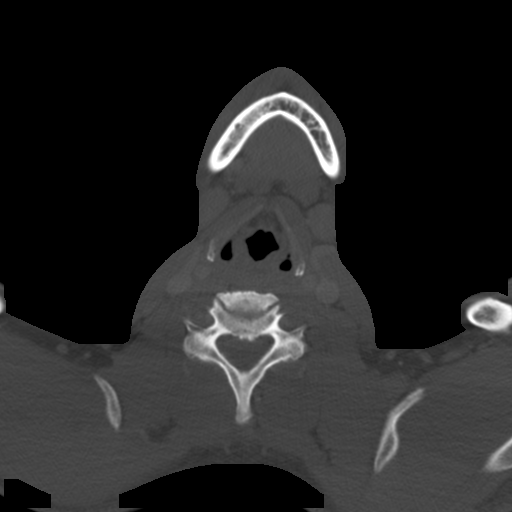
[im 81/122  bone]
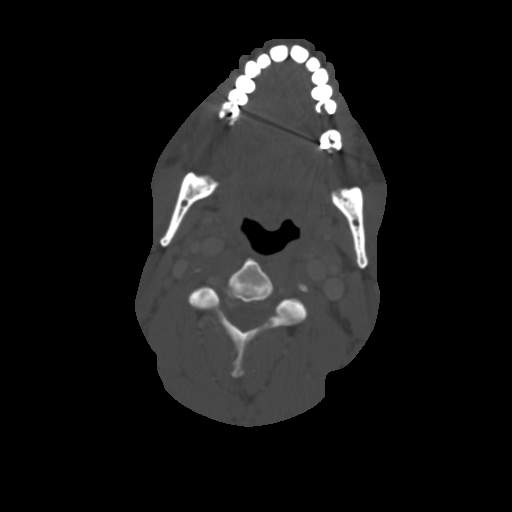
[im 101/122  soft-tissue]
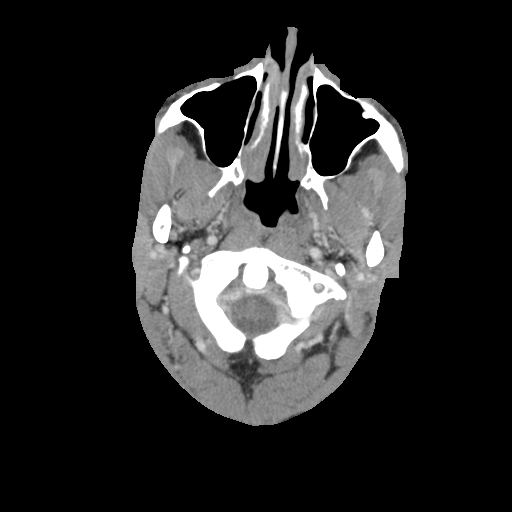
[im 101/122  bone]
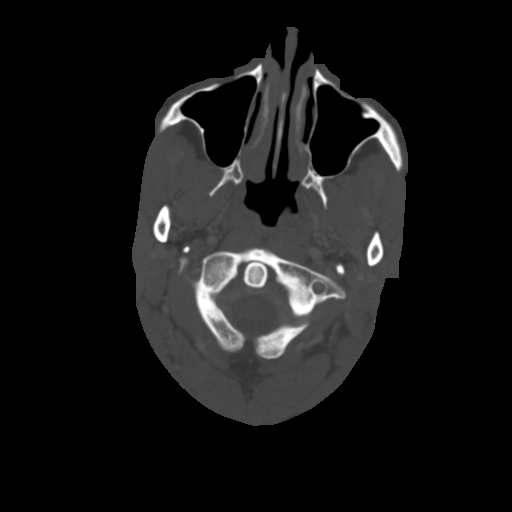

[Series 6: coronal st · coronal · 0.48mm/px · 3 of 139 slices shown]
[im 28/139  bone]
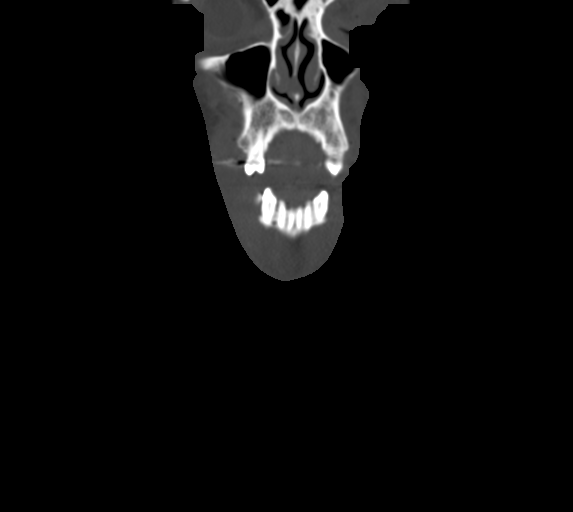
[im 56/139  bone]
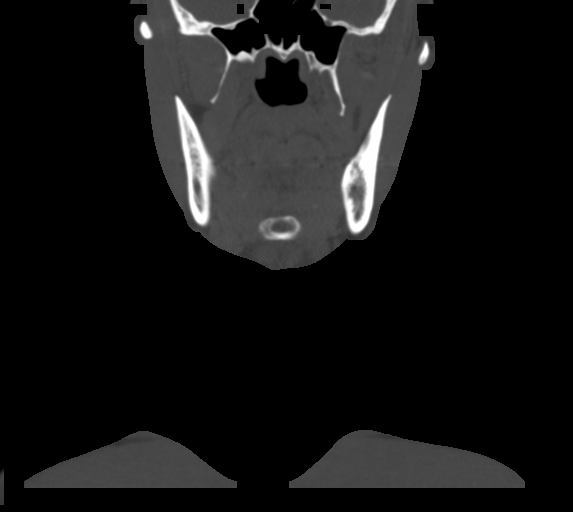
[im 83/139  bone]
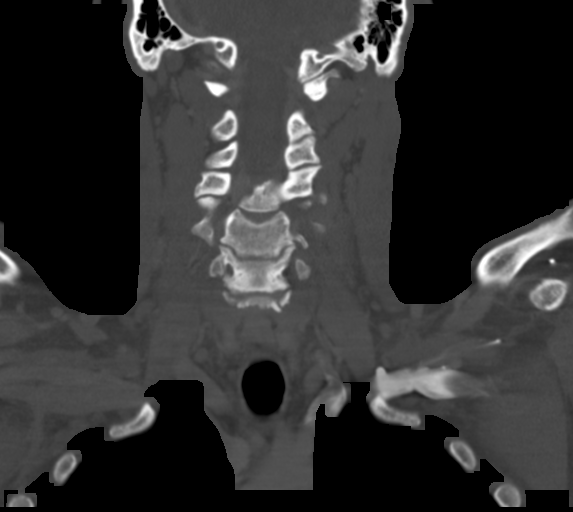

[Series 7: sagittal st · sagittal · 0.49mm/px · 5 of 123 slices shown, 6 images]
[im 41/123  bone]
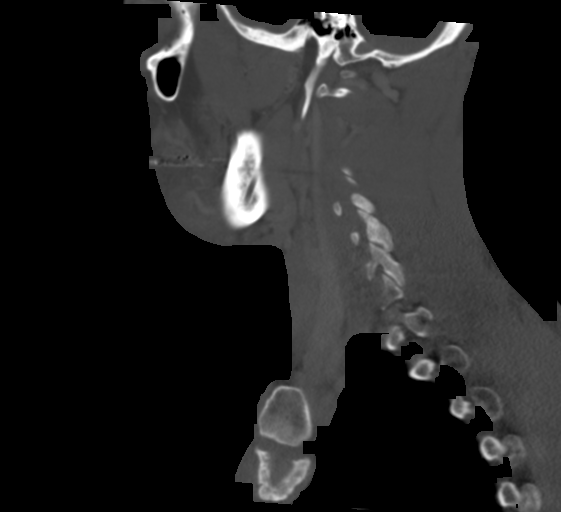
[im 51/123  bone]
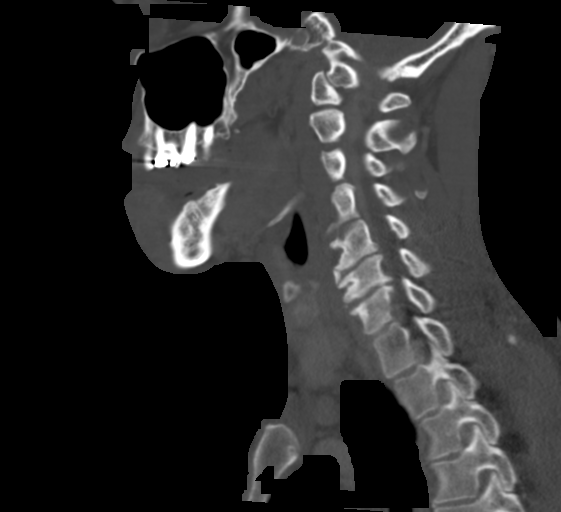
[im 62/123  soft-tissue]
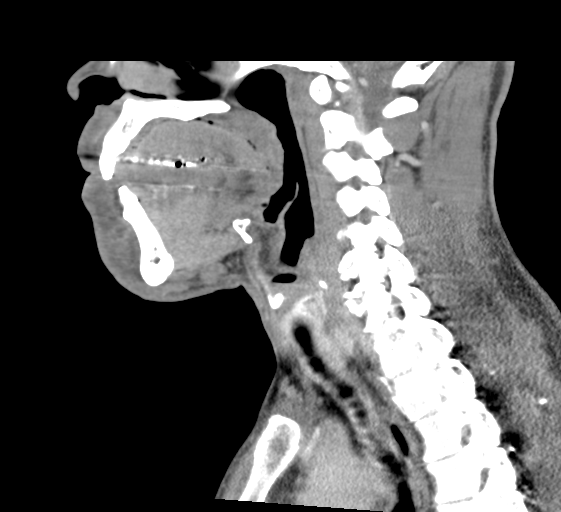
[im 62/123  bone]
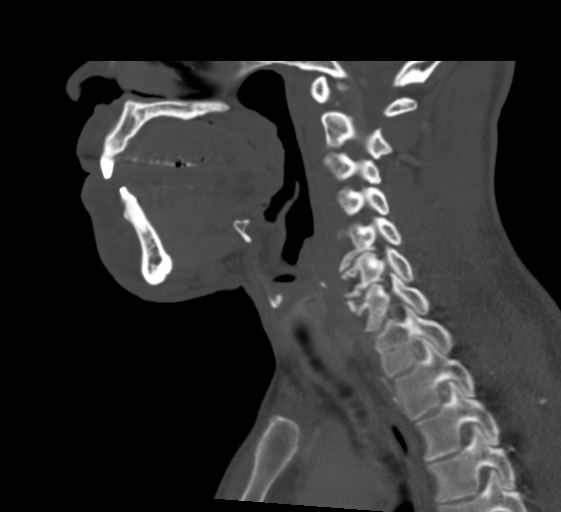
[im 72/123  bone]
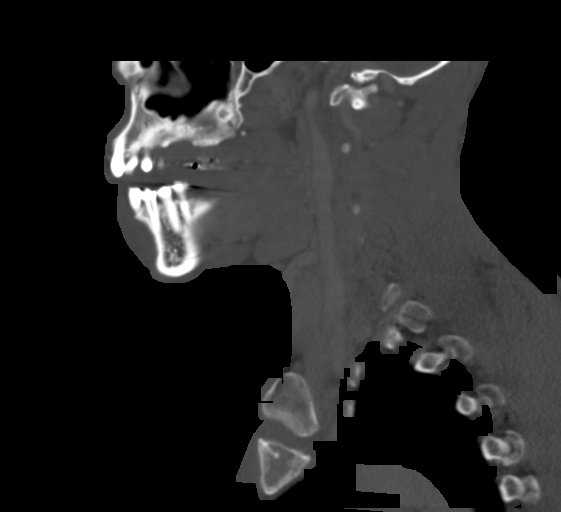
[im 82/123  bone]
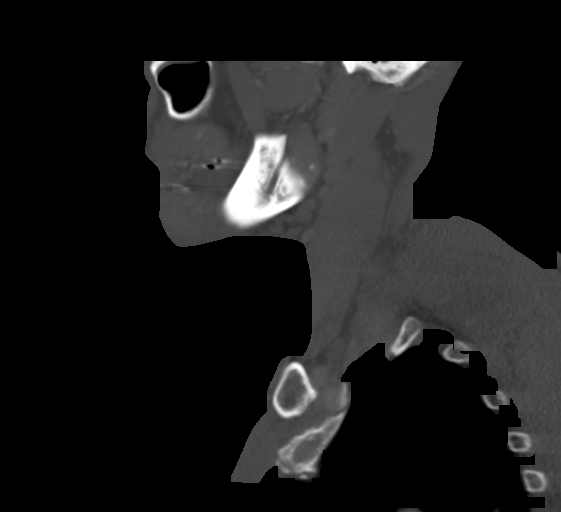

[Series 8: ax axial recons · axial · 0.39mm/px · 1 of 121 slices shown]
[im 21/121  bone]
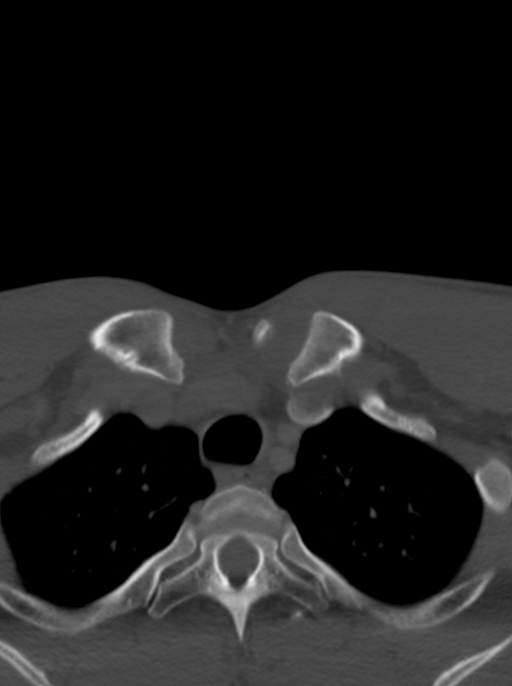

[14 of 33 positions shown; findings below may reference images not displayed]

FINDINGS: Pharynx and larynx: No suspicious enhancement or swelling. Negative
floor of mouth.

Salivary glands: Small stone in the right parotid tail. No
inflammation or masslike finding.

Thyroid: Normal

Lymph nodes: Mild reactive appearing enlargement of submental lymph
nodes. No suppurative changes.

Vascular: Major vessels are patent.  No noted atheromatous changes.

Limited intracranial: Negative

Visualized orbits: Negative

Mastoids and visualized paranasal sinuses: Mild patchy ethmoid
mucosal thickening. No acute finding.

Skeleton: There is a lucency in the right mandible between teeth 26
and 27 with small defect in the buccal cortex. Contiguous with this
area is a channel like low-density measuring 3 cm in length by 9 mm
in thickness, extending to the skin surface. The neighboring soft
tissues are thickened and stranded. Multiple right-sided mandibular
teeth have been extracted. There is no periosteal reaction or
involucrum in the mandible.

Upper chest: No acute finding.  Negative for pneumonia.

Other: Spondylosis and disc degeneration. Spurring at C5-6 and C6-7
encroaches on the ventral cord. At the same level there is foraminal
narrowing, particularly severe on the right at C6-7.
IMPRESSION: Chin cellulitis surrounding a channel like collection/abscess
measuring 30 x 9 mm, nearly erupted through the left chin skin
surface. The deep portion of the collection is contiguous with a
lucency in the right mandible present between teeth 26 and 27,
consider primary odontogenic infection. No definitive changes of
osteomyelitis in the mandible.

## 2018-10-26 ENCOUNTER — Other Ambulatory Visit: Payer: Self-pay

## 2018-10-26 ENCOUNTER — Encounter (HOSPITAL_COMMUNITY): Payer: Self-pay | Admitting: Emergency Medicine

## 2018-10-26 ENCOUNTER — Emergency Department (HOSPITAL_COMMUNITY)
Admission: EM | Admit: 2018-10-26 | Discharge: 2018-10-27 | Disposition: A | Discharge status: Home / Self Care | Payer: BC Managed Care – PPO | Attending: Emergency Medicine | Admitting: Emergency Medicine

## 2018-10-26 DIAGNOSIS — S161XXA Strain of muscle, fascia and tendon at neck level, initial encounter: Secondary | ICD-10-CM | POA: Diagnosis not present

## 2018-10-26 DIAGNOSIS — F17210 Nicotine dependence, cigarettes, uncomplicated: Secondary | ICD-10-CM | POA: Insufficient documentation

## 2018-10-26 DIAGNOSIS — H1131 Conjunctival hemorrhage, right eye: Secondary | ICD-10-CM

## 2018-10-26 DIAGNOSIS — Y929 Unspecified place or not applicable: Secondary | ICD-10-CM | POA: Insufficient documentation

## 2018-10-26 DIAGNOSIS — S0181XA Laceration without foreign body of other part of head, initial encounter: Secondary | ICD-10-CM

## 2018-10-26 DIAGNOSIS — Y939 Activity, unspecified: Secondary | ICD-10-CM | POA: Insufficient documentation

## 2018-10-26 DIAGNOSIS — Y999 Unspecified external cause status: Secondary | ICD-10-CM | POA: Insufficient documentation

## 2018-10-26 DIAGNOSIS — S0990XA Unspecified injury of head, initial encounter: Secondary | ICD-10-CM | POA: Diagnosis present
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
## 2018-10-27 ENCOUNTER — Emergency Department (HOSPITAL_COMMUNITY): Payer: BC Managed Care – PPO

## 2018-10-27 MED ORDER — FLUORESCEIN SODIUM 1 MG OP STRP
1.0000 | ORAL_STRIP | Freq: Once | OPHTHALMIC | Status: AC
  Administered 2018-10-27: 1 via OPHTHALMIC
  Filled 2018-10-27: qty 1

## 2018-10-27 MED ORDER — HYDROCODONE-ACETAMINOPHEN 5-325 MG PO TABS: 1.0000 | ORAL_TABLET | Freq: Four times a day (QID) | ORAL | 0 refills | Status: DC | PRN

## 2018-10-27 MED ORDER — TETRACAINE HCL 0.5 % OP SOLN
2.0000 [drp] | Freq: Once | OPHTHALMIC | Status: AC
  Administered 2018-10-27: 2 [drp] via OPHTHALMIC
  Filled 2018-10-27: qty 4

## 2018-10-27 MED ORDER — IBUPROFEN 400 MG PO TABS
400.0000 mg | ORAL_TABLET | Freq: Once | ORAL | Status: AC
Start: 2018-10-27 — End: 2018-10-27
  Administered 2018-10-27: 400 mg via ORAL
  Filled 2018-10-27: qty 1

## 2018-10-27 MED ORDER — HYDROCODONE-ACETAMINOPHEN 5-325 MG PO TABS
1.0000 | ORAL_TABLET | Freq: Once | ORAL | Status: AC
  Administered 2018-10-27: 1 via ORAL
  Filled 2018-10-27: qty 1

## 2018-10-27 NOTE — ED Notes (Signed)
Provider at bedside
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
## 2018-10-27 NOTE — ED Notes (Signed)
Patient verbalizes understanding of discharge instructions. Opportunity for questioning and answers were provided. Armband removed by staff, pt discharged from ED ambulatory.   

## 2018-10-27 NOTE — ED Notes (Signed)
Patient transported to X-ray 

## 2020-09-28 ENCOUNTER — Other Ambulatory Visit: Payer: Self-pay

## 2020-09-28 ENCOUNTER — Ambulatory Visit (HOSPITAL_COMMUNITY)
Admission: EM | Admit: 2020-09-28 | Discharge: 2020-09-28 | Disposition: A | Discharge status: Home / Self Care | Payer: 59 | Attending: Family Medicine | Admitting: Family Medicine

## 2020-09-28 ENCOUNTER — Ambulatory Visit (HOSPITAL_COMMUNITY): Payer: 59

## 2020-09-28 ENCOUNTER — Encounter (HOSPITAL_COMMUNITY): Payer: Self-pay | Admitting: *Deleted

## 2020-09-28 DIAGNOSIS — M79672 Pain in left foot: Secondary | ICD-10-CM | POA: Diagnosis not present

## 2020-09-28 MED ORDER — PREDNISONE 20 MG PO TABS: 40.0000 mg | ORAL_TABLET | Freq: Every day | ORAL | 0 refills | Status: DC

## 2020-09-28 NOTE — Discharge Instructions (Signed)
[facility]  Address: [address] [place], [place], [place] [address] Hours:  Closed ? Travis West Wed Phone: [phone]

## 2020-09-28 NOTE — ED Triage Notes (Signed)
Pt reports Lt foot pain that started on Friday. Pt reports top of foot hurst .
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
## 2020-12-03 ENCOUNTER — Emergency Department (HOSPITAL_COMMUNITY): Payer: 59

## 2020-12-03 ENCOUNTER — Encounter (HOSPITAL_COMMUNITY): Payer: Self-pay | Admitting: Emergency Medicine

## 2020-12-03 ENCOUNTER — Other Ambulatory Visit: Payer: Self-pay

## 2020-12-03 ENCOUNTER — Emergency Department (HOSPITAL_COMMUNITY)
Admission: EM | Admit: 2020-12-03 | Discharge: 2020-12-03 | Disposition: A | Discharge status: Home / Self Care | Payer: 59 | Attending: Emergency Medicine | Admitting: Emergency Medicine

## 2020-12-03 DIAGNOSIS — R1031 Right lower quadrant pain: Secondary | ICD-10-CM | POA: Diagnosis present

## 2020-12-03 DIAGNOSIS — R102 Pelvic and perineal pain: Secondary | ICD-10-CM

## 2020-12-03 DIAGNOSIS — R1032 Left lower quadrant pain: Secondary | ICD-10-CM | POA: Diagnosis not present

## 2020-12-03 DIAGNOSIS — F17210 Nicotine dependence, cigarettes, uncomplicated: Secondary | ICD-10-CM | POA: Diagnosis not present

## 2020-12-03 DIAGNOSIS — R1030 Lower abdominal pain, unspecified: Secondary | ICD-10-CM

## 2020-12-03 LAB — URINALYSIS, ROUTINE W REFLEX MICROSCOPIC
Bilirubin Urine: NEGATIVE
Glucose, UA: NEGATIVE mg/dL
Hgb urine dipstick: NEGATIVE
Ketones, ur: NEGATIVE mg/dL
Leukocytes,Ua: NEGATIVE
Nitrite: NEGATIVE
Protein, ur: NEGATIVE mg/dL
Specific Gravity, Urine: 1.013 (ref 1.005–1.030)
pH: 6 (ref 5.0–8.0)

## 2020-12-03 LAB — CBC
HCT: 42.3 % (ref 39.0–52.0)
Hemoglobin: 13.8 g/dL (ref 13.0–17.0)
MCH: 30.1 pg (ref 26.0–34.0)
MCHC: 32.6 g/dL (ref 30.0–36.0)
MCV: 92.2 fL (ref 80.0–100.0)
Platelets: 296 10*3/uL (ref 150–400)
RBC: 4.59 MIL/uL (ref 4.22–5.81)
RDW: 15 % (ref 11.5–15.5)
WBC: 13.6 10*3/uL — ABNORMAL HIGH (ref 4.0–10.5)
nRBC: 0 % (ref 0.0–0.2)

## 2020-12-03 LAB — BASIC METABOLIC PANEL
Anion gap: 7 (ref 5–15)
BUN: 6 mg/dL (ref 6–20)
CO2: 28 mmol/L (ref 22–32)
Calcium: 9.3 mg/dL (ref 8.9–10.3)
Chloride: 103 mmol/L (ref 98–111)
Creatinine, Ser: 1.03 mg/dL (ref 0.61–1.24)
GFR, Estimated: >60 mL/min (ref >60)
Glucose, Bld: 84 mg/dL (ref 70–99)
Potassium: 4.7 mmol/L (ref 3.5–5.1)
Sodium: 138 mmol/L (ref 135–145)

## 2020-12-03 MED ORDER — MORPHINE SULFATE (PF) 4 MG/ML IV SOLN
4.0000 mg | Freq: Once | INTRAVENOUS | Status: AC
  Administered 2020-12-03: 4 mg via INTRAVENOUS
  Filled 2020-12-03: qty 1

## 2020-12-03 MED ORDER — SENNOSIDES-DOCUSATE SODIUM 8.6-50 MG PO TABS: 1.0000 | ORAL_TABLET | Freq: Every day | ORAL | 0 refills | Status: DC | PRN

## 2020-12-03 MED ORDER — CYCLOBENZAPRINE HCL 10 MG PO TABS: 10.0000 mg | ORAL_TABLET | Freq: Two times a day (BID) | ORAL | 0 refills | Status: DC | PRN

## 2020-12-03 MED ORDER — IOHEXOL 350 MG/ML SOLN
100.0000 mL | Freq: Once | INTRAVENOUS | Status: AC | PRN
  Administered 2020-12-03: 80 mL via INTRAVENOUS

## 2020-12-03 MED ORDER — IBUPROFEN 800 MG PO TABS: 800.0000 mg | ORAL_TABLET | Freq: Three times a day (TID) | ORAL | 0 refills | Status: AC

## 2020-12-03 NOTE — Discharge Instructions (Addendum)
Your work-up in the ER was reassuring.  Is possible that you have an injury to the muscles around your bladder or your lower abdomen.  I recommend that you rest for the next 7 to 10 days, avoid straining your abdomen.  You can buy Bengay or icy hot or over-the-counter muscle rubs and use this 2 or 3 times a day where it hurts.  You can also put an ice pack over this area for 10 minutes at a time a few times a day.  If the pain gets a lot worse, or if you have vomiting, cannot have a bowel movement, have difficulty or trouble pain, please come back to the ER.
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
## 2020-12-03 NOTE — ED Triage Notes (Signed)
Pt presents d/t pubic/groin pain.  States pain has gotten persistent for the last 2-3 days.  Believes he has a hernia however no formal dx.

## 2020-12-03 NOTE — ED Notes (Signed)
Patient transported to CT 
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
## 2021-10-12 ENCOUNTER — Encounter (HOSPITAL_COMMUNITY): Payer: Self-pay

## 2021-10-12 ENCOUNTER — Emergency Department (HOSPITAL_COMMUNITY)
Admission: EM | Admit: 2021-10-12 | Discharge: 2021-10-12 | Disposition: A | Discharge status: Home / Self Care | Payer: Managed Care, Other (non HMO) | Attending: Emergency Medicine | Admitting: Emergency Medicine

## 2021-10-12 ENCOUNTER — Other Ambulatory Visit: Payer: Self-pay

## 2021-10-12 DIAGNOSIS — Z7982 Long term (current) use of aspirin: Secondary | ICD-10-CM | POA: Insufficient documentation

## 2021-10-12 DIAGNOSIS — K047 Periapical abscess without sinus: Secondary | ICD-10-CM | POA: Diagnosis not present

## 2021-10-12 DIAGNOSIS — K0889 Other specified disorders of teeth and supporting structures: Secondary | ICD-10-CM | POA: Diagnosis present

## 2021-10-12 MED ORDER — HYDROCODONE-ACETAMINOPHEN 5-325 MG PO TABS
1.0000 | ORAL_TABLET | Freq: Once | ORAL | Status: AC
  Administered 2021-10-12: 1 via ORAL
  Filled 2021-10-12: qty 1

## 2021-10-12 MED ORDER — AMOXICILLIN-POT CLAVULANATE 875-125 MG PO TABS: 1.0000 | ORAL_TABLET | Freq: Two times a day (BID) | ORAL | 0 refills | Status: AC

## 2021-10-12 NOTE — Discharge Instructions (Signed)
You were seen today for dental infection.  I prescribed antibiotics for coverage to help fight any potential infection.  As discussed, you need dental care for extraction and further management.  You may take ibuprofen and Tylenol up to the maximum allowed per bottle.  Recommend ice pack on the external jaw to help with swelling and inflammation

## 2021-10-12 NOTE — ED Triage Notes (Signed)
Pt reports with dental pain and swelling to the left upper side since yesterday. Pt reports having 2 broken teeth x 1 month.

## 2021-10-12 NOTE — ED Notes (Signed)
I provided reinforced discharge education based off of discharge instructions. Pt acknowledged and understood my education. Pt had no further questions/concerns for provider/myself.  °

## 2021-10-12 NOTE — ED Provider Notes (Addendum)
[facility] DEPT Provider Note   CSN: [id number] Arrival date & time: 10/12/21  2028     History  Chief Complaint  Patient presents with   Dental Pain    Travis West is a 51 y.o. male.  Patient presents to the hospital complaining of left-sided tooth pain and swelling.  Patient states that for the past few days he has noted some swelling to the upper left portion of his jaw.  He states that he has multiple cracked and broken teeth and has been unable to afford dental care for extractions.  Denies any fevers.  No relevant past medical history.  HPI     Home Medications Prior to Admission medications   Medication Sig Start Date End Date Taking? Authorizing Provider  amoxicillin-clavulanate (AUGMENTIN) 875-125 MG tablet Take 1 tablet by mouth every 12 (twelve) hours for 10 days. 10/12/21 10/22/21 Yes Dorothyann Peng, PA-C  acetaminophen (TYLENOL) 500 MG tablet Take 1,000 mg by mouth every 4 (four) hours as needed for moderate pain.    [provider]  Aspirin-Acetaminophen-Caffeine (GOODY HEADACHE PO) Take 1 each by mouth daily as needed (pain).     [provider]  cyclobenzaprine (FLEXERIL) 10 MG tablet Take 1 tablet (10 mg total) by mouth 2 (two) times daily as needed for up to 20 doses for muscle spasms. 12/03/20   Wyvonnia Dusky, MD  HYDROcodone-acetaminophen (NORCO/VICODIN) 5-325 MG tablet Take 1 tablet by mouth every 6 (six) hours as needed for severe pain. 10/27/18   Ripley Fraise, MD  predniSONE (DELTASONE) 20 MG tablet Take 2 tablets (40 mg total) by mouth daily with breakfast. 09/28/20   Volney American, PA-C  senna-docusate (SENOKOT-S) 8.6-50 MG tablet Take 1 tablet by mouth daily as needed for up to 30 doses for mild constipation. 12/03/20   Wyvonnia Dusky, MD      Allergies    Patient has no known allergies.    Review of Systems   Review of Systems  Constitutional:  Negative for fever.  HENT:  Positive for  dental problem.   Respiratory:  Negative for shortness of breath.   Cardiovascular:  Negative for chest pain.  Gastrointestinal:  Negative for abdominal pain and nausea.   Physical Exam Updated Vital Signs BP (!) 167/99 (BP Location: Left Arm)   Pulse 76   Temp 98.1 F (36.7 C) (Oral)   Resp 18   Ht 6\' 1"  (1.854 m)   Wt 81.6 kg   SpO2 98%   BMI 23.75 kg/m  Physical Exam Vitals and nursing note reviewed.  HENT:     Head: Normocephalic.     Mouth/Throat:     Mouth: Mucous membranes are moist.     Tongue: Tongue does not deviate from midline.     Palate: No mass.     Pharynx: No pharyngeal swelling or uvula swelling.     Tonsils: No tonsillar abscesses.   Eyes:     Conjunctiva/sclera: Conjunctivae normal.  Cardiovascular:     Rate and Rhythm: Normal rate.  Pulmonary:     Effort: Pulmonary effort is normal.  Musculoskeletal:     Cervical back: Normal range of motion and neck supple. No tenderness.  Lymphadenopathy:     Cervical: No cervical adenopathy.  Skin:    General: Skin is warm and dry.  Neurological:     Mental Status: He is alert.    ED Results / Procedures / Treatments   Labs (all labs ordered are listed,  but only abnormal results are displayed) Labs Reviewed - No data to display  EKG None  Radiology No results found.  Procedures Procedures    Medications Ordered in ED Medications  HYDROcodone-acetaminophen (NORCO/VICODIN) 5-325 MG per tablet 1 tablet (has no administration in time range)    ED Course/ Medical Decision Making/ A&P                           Medical Decision Making Risk Prescription drug management.   Patient presents with a concern of dental pain and gingival swelling.  Differential includes dental caries, dental abscess, peritonsillar abscess, retropharyngeal abscess, and others.  No swelling concerning for peritonsillar or retropharyngeal abscess.  Patient does have multiple dental caries and poor dentition.  Mild  gingival swelling consistent with a dental abscess  I ordered hydrocodone for the patient for pain. The patient had improved upon reassessment.   No easily drainable abscess noted.  Plan to discharge patient home with oral antibiotic. Recommend dental follow up        Final Clinical Impression(s) / ED Diagnoses Final diagnoses:  Dental infection    Rx / DC Orders ED Discharge Orders          Ordered    amoxicillin-clavulanate (AUGMENTIN) 875-125 MG tablet  Every 12 hours        10/12/21 2156              Ronny Bacon 10/12/21 2158    Dorothyann Peng, PA-C 10/12/21 2220    Milton Ferguson, MD 10/12/21 2245

## 2024-04-22 ENCOUNTER — Ambulatory Visit

## 2024-04-22 VITALS — BP 159/99 | HR 67 | Ht 73.0 in | Wt 193.6 lb

## 2024-04-22 DIAGNOSIS — I10 Essential (primary) hypertension: Secondary | ICD-10-CM | POA: Diagnosis not present

## 2024-04-22 DIAGNOSIS — Z9189 Other specified personal risk factors, not elsewhere classified: Secondary | ICD-10-CM | POA: Diagnosis not present

## 2024-04-22 DIAGNOSIS — F17210 Nicotine dependence, cigarettes, uncomplicated: Secondary | ICD-10-CM

## 2024-04-22 MED ORDER — VALSARTAN 40 MG PO TABS: 40.0000 mg | ORAL_TABLET | Freq: Every day | ORAL | 1 refills | Status: AC

## 2024-04-22 NOTE — Patient Instructions (Addendum)
 Your blood pressure was high today. Today we are starting you on a medication called Valsartan . You will come back and see me in 1 week so we can recheck your blood pressure at that time. When you get your own blood pressure cuff, the best time to check your blood pressure is in the morning before you have had any caffeine.   Call 1800-QUIT-NOW for help with stopping smoking. This number has some free resources and 24/7 counseling for smoking cessation.   We are getting some blood work today. I will send you a MyChart message with those results.

## 2024-04-22 NOTE — Progress Notes (Unsigned)
° ° °  SUBJECTIVE:   CHIEF COMPLAINT / HPI: Establish care   Travis West is a 53 year old male presenting to establish care today.  He states he has not had a primary care doctor in a very long time.  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  It was high at that time, and he has checked it a few times himself at the pharmacy and found it to be high as well.  His wife encouraged him to start seeing a doctor regularly.  He states that when he has checked his blood pressure it has been around 147/97.  He ordered a blood pressure cuff to have at home.  He also states that his legs get fatigued whenever he walks up the stairs to his home.  He and his wife live on the third floor.  Despite living there for 6 months he states he still feels like he did a leg workout when he got to the top.  He states that he has not exercised consistently for quite some time.  He denies feeling this like fatigue when just walking.  He does state it can sometimes happen when he stands for a long time.  He notices some mild swelling of his ankles from time to time.  Patient is trying to quit smoking cigarettes.  He states he currently smokes 3 to 4 cigarettes a day.  He has never smoked a full pack per day.  He states that he has been smoking for 20 years.  PERTINENT  PMH / PSH: None  OBJECTIVE:   BP (!) 159/99   Pulse 67   Ht 6' 1 (1.854 m)   Wt 193 lb 9.6 oz (87.8 kg)   SpO2 99%   BMI 25.54 kg/m   General: Well-appearing male, NAD HEENT: Head atraumatic, normocephalic, PERRL, TMs normal bilaterally, nasal passages clear, throat nonerythematous/nonedematous Cardiovascular: RRR, no M/R/G, radial and posterior tibialis pulses 2+ bilaterally Respiratory: CTAB, normal work of breathing on room air MSK: No cervical, thoracic, lumbar spinal tenderness Abdomen: Bowel sounds present, soft, nondistended, nontender to palpation  ASSESSMENT/PLAN:   Assessment & Plan Hypertension,  unspecified type Initial blood pressure this visit 177/100.  Repeat blood pressure 159/99. - Prescribed valsartan  40 mg today - Follow-up in 1 week Smoking 1/2 pack a day or less Patient currently smoking 3/4 cigarettes a day.  Working on quitting altogether.  Discussed and patient interested in getting in touch with our clinical pharmacist, Dr. Koval, for help with smoking cessation. - [facility] referral for smoking cessation - Appointment made with Dr. Koval on 05/06/2024 Sedentary lifestyle Patient works as a naval architect. - Labs ordered: BMP with GFR, A1c, lipid panel      [name], DO [facility]

## 2024-04-23 ENCOUNTER — Ambulatory Visit: Payer: Self-pay

## 2024-04-23 DIAGNOSIS — I10 Essential (primary) hypertension: Secondary | ICD-10-CM | POA: Insufficient documentation

## 2024-04-23 LAB — BASIC METABOLIC PANEL WITH GFR
BUN/Creatinine Ratio: 8 — ABNORMAL LOW (ref 9–20)
BUN: 9 mg/dL (ref 6–24)
CO2: 23 mmol/L (ref 20–29)
Calcium: 9.2 mg/dL (ref 8.7–10.2)
Chloride: 102 mmol/L (ref 96–106)
Creatinine, Ser: 1.09 mg/dL (ref 0.76–1.27)
Glucose: 77 mg/dL (ref 70–99)
Potassium: 3.9 mmol/L (ref 3.5–5.2)
Sodium: 143 mmol/L (ref 134–144)
eGFR: 81 mL/min/1.73 (ref >59)

## 2024-04-23 LAB — LIPID PANEL
Chol/HDL Ratio: 3.3 ratio (ref 0.0–5.0)
Cholesterol, Total: 144 mg/dL (ref 100–199)
HDL: 44 mg/dL (ref >39)
LDL Chol Calc (NIH): 90 mg/dL (ref 0–99)
Triglycerides: 42 mg/dL (ref 0–149)
VLDL Cholesterol Cal: 10 mg/dL (ref 5–40)

## 2024-04-23 LAB — HEMOGLOBIN A1C
Est. average glucose Bld gHb Est-mCnc: 108 mg/dL
Hgb A1c MFr Bld: 5.4 % (ref 4.8–5.6)

## 2024-04-23 MED ORDER — ROSUVASTATIN CALCIUM 5 MG PO TABS: 5.0000 mg | ORAL_TABLET | Freq: Every day | ORAL | 3 refills | Status: AC

## 2024-04-23 NOTE — Assessment & Plan Note (Signed)
 Initial blood pressure this visit 177/100.  Repeat blood pressure 159/99. - Prescribed valsartan  40 mg today - Follow-up in 1 week

## 2024-04-29 ENCOUNTER — Ambulatory Visit: Payer: Self-pay

## 2024-04-29 VITALS — BP 125/85 | HR 83 | Ht 73.0 in | Wt 195.2 lb

## 2024-04-29 DIAGNOSIS — I10 Essential (primary) hypertension: Secondary | ICD-10-CM | POA: Diagnosis not present

## 2024-04-29 MED ORDER — VALSARTAN 80 MG PO TABS: 80.0000 mg | ORAL_TABLET | Freq: Every day | ORAL | 0 refills | Status: AC

## 2024-04-29 NOTE — Patient Instructions (Addendum)
 Your blood pressure is still on the high side. We are going to increase your medicine to the next dose which is 80 mg. You can take 2 of your 40 mg pills until that is out and the new medicine is at your pharmacy.   We will also check your kidney function today. I will let you know those results when they come back.   At your appointment with Dr. Koval next week, he will check your blood pressure as well.

## 2024-04-29 NOTE — Progress Notes (Signed)
" ° ° °  SUBJECTIVE:   CHIEF COMPLAINT / HPI: BP f/u   Patient presents today for follow-up on blood pressure after being started on Valsartan  last week. The same day of his last appointment his home blood pressure cuff was delivered. He states that at home he has been seeing on average 168 for the top number and between 90 to the low 100s on the bottom. He denies any symptoms of headache, blurry vision, dizziness, light headedness.   No family history of MI or stroke.   PERTINENT  PMH / PSH: HTN, smoker (working on quitting)   OBJECTIVE:   Manual BP 125/85   Pulse 83   Ht 6' 1 (1.854 m)   Wt 195 lb 3.2 oz (88.5 kg)   SpO2 99%   BMI 25.75 kg/m   BP on machine initially 159/95 Repeat on machine 137/79  General: Well appearing male, NAD Cardiovascular: RRR, no M/R/G, radial and posterior tibialis pulses +2 bilaterally  Respiratory: CTAB, normal work of breathing on room air   ASSESSMENT/PLAN:   Assessment & Plan Hypertension, unspecified type Goal: systolic <130 and diastolic < 90. Still above goal today.  - Increased Valsartan  to 80 mg today - Educated patient that he can double up on his Valsartan  40 mg before starting the 80 mg - Follow-up in 1 week with Dr. Koval for smoking cessation      Travis KANDICE Lee, DO [facility] "

## 2024-04-30 ENCOUNTER — Ambulatory Visit: Payer: Self-pay

## 2024-04-30 LAB — BASIC METABOLIC PANEL WITH GFR
BUN/Creatinine Ratio: 9 (ref 9–20)
BUN: 11 mg/dL (ref 6–24)
CO2: 25 mmol/L (ref 20–29)
Calcium: 9.3 mg/dL (ref 8.7–10.2)
Chloride: 104 mmol/L (ref 96–106)
Creatinine, Ser: 1.19 mg/dL (ref 0.76–1.27)
Glucose: 83 mg/dL (ref 70–99)
Potassium: 3.9 mmol/L (ref 3.5–5.2)
Sodium: 143 mmol/L (ref 134–144)
eGFR: 73 mL/min/1.73

## 2024-04-30 NOTE — Assessment & Plan Note (Signed)
 Goal: systolic <130 and diastolic < 90. Still above goal today.  - Increased Valsartan  to 80 mg today - Educated patient that he can double up on his Valsartan  40 mg before starting the 80 mg - Follow-up in 1 week with Dr. Koval for smoking cessation

## 2024-05-06 ENCOUNTER — Ambulatory Visit: Admitting: Pharmacist

## 2024-05-15 ENCOUNTER — Ambulatory Visit: Admitting: Pharmacist

## 2024-05-16 ENCOUNTER — Encounter: Payer: Self-pay | Admitting: Pharmacist

## 2024-06-03 ENCOUNTER — Telehealth: Payer: Self-pay | Admitting: Pharmacist

## 2024-06-03 NOTE — Telephone Encounter (Signed)
 Reviewed and agree with Dr Rennis plan.

## 2024-06-03 NOTE — Telephone Encounter (Signed)
 Patient contacted for follow/up of tobacco intake reduction / cessation attempt.   Since last contact patient reports he has been smoking ~ 5 cigs per day ( max in past 10 per day) States he has been working on cutting back  Scheduled for appointment 2 weeks - 2/20 at 3:00 PM  Total time with patient call and documentation of interaction: 9 minutes.

## 2024-06-17 ENCOUNTER — Ambulatory Visit: Admitting: Pharmacist
